# Patient Record
Sex: Male | Born: 1980 | Race: White | Hispanic: No | Marital: Married | Smoking: Never smoker
Health system: Southern US, Community
[De-identification: ages and names within clinical notes are randomized; demographics above are authoritative.]

## PROBLEM LIST (undated history)

## (undated) HISTORY — PX: CLAVICLE SURGERY: SHX598

---

## 2012-07-14 ENCOUNTER — Ambulatory Visit (INDEPENDENT_AMBULATORY_CARE_PROVIDER_SITE_OTHER): Payer: Managed Care, Other (non HMO) | Admitting: Family Medicine

## 2012-07-14 VITALS — BP 120/88 | HR 79 | Temp 98.5°F | Resp 16 | Ht 67.5 in | Wt 208.8 lb

## 2012-07-14 DIAGNOSIS — J029 Acute pharyngitis, unspecified: Secondary | ICD-10-CM

## 2012-07-14 LAB — POCT RAPID STREP A (OFFICE): Rapid Strep A Screen: NEGATIVE

## 2012-07-14 MED ORDER — BENZONATATE 100 MG PO CAPS: 100.0000 mg | ORAL_CAPSULE | Freq: Three times a day (TID) | ORAL | Status: AC | PRN

## 2012-07-14 MED ORDER — IPRATROPIUM BROMIDE 0.03 % NA SOLN: 2.0000 | Freq: Two times a day (BID) | NASAL | Status: AC

## 2012-07-14 MED ORDER — AMOXICILLIN 500 MG PO CAPS: 500.0000 mg | ORAL_CAPSULE | Freq: Three times a day (TID) | ORAL | Status: AC

## 2012-07-14 NOTE — Patient Instructions (Addendum)
Sore Throat A sore throat is pain, burning, irritation, or scratchiness of the throat. There is often pain or tenderness when swallowing or talking. A sore throat may be accompanied by other symptoms, such as coughing, sneezing, fever, and swollen neck glands. A sore throat is often the first sign of another sickness, such as a cold, flu, strep throat, or mononucleosis (commonly known as mono). Most sore throats go away without medical treatment. CAUSES  The most common causes of a sore throat include:  A viral infection, such as a cold, flu, or mono.  A bacterial infection, such as strep throat, tonsillitis, or whooping cough.  Seasonal allergies.  Dryness in the air.  Irritants, such as smoke or pollution.  Gastroesophageal reflux disease (GERD). HOME CARE INSTRUCTIONS   Only take over-the-counter medicines as directed by your caregiver.  Drink enough fluids to keep your urine clear or pale yellow.  Rest as needed.  Try using throat sprays, lozenges, or sucking on hard candy to ease any pain (if older than 4 years or as directed).  Sip warm liquids, such as broth, herbal tea, or warm water with honey to relieve pain temporarily. You may also eat or drink cold or frozen liquids such as frozen ice pops.  Gargle with salt water (mix 1 tsp salt with 8 oz of water).  Do not smoke and avoid secondhand smoke.  Put a cool-mist humidifier in your bedroom at night to moisten the air. You can also turn on a hot shower and sit in the bathroom with the door closed for 5 10 minutes. SEEK IMMEDIATE MEDICAL CARE IF:  You have difficulty breathing.  You are unable to swallow fluids, soft foods, or your saliva.  You have increased swelling in the throat.  Your sore throat does not get better in 7 days.  You have nausea and vomiting.  You have a fever or persistent symptoms for more than 2 3 days.  You have a fever and your symptoms suddenly get worse. MAKE SURE YOU:   Understand  these instructions.  Will watch your condition.  Will get help right away if you are not doing well or get worse. Document Released: 02/06/2004 Document Revised: 12/16/2011 Document Reviewed: 09/06/2011 [employer] Patient Information 2014 [employer].  

## 2012-07-14 NOTE — Progress Notes (Signed)
[address]   [place], [place]  [address]   [phone]  Subjective:    Patient ID: Robert Torres, male    DOB: 07-08-1980, 32 y.o.   MRN: [id number]  HPI This 32 y.o. male presents for evaluation of sore throat. Onset four days ago.  +feverish. +chills/sweats. +HA initially; +ear congestion; mild ear pain; +ST diffuse; +pain with swallowing.  +rhinorrhea; +nasal congestion; +cough.  No n/v/d. No abdominal pain. No tick bites. No rash.  Has been using ginger, raw honey with minimal relief.  Multiple family members sick with similar symptoms; non-smoker.  CPA.  PCP: Deboraha Sprang Physician.  Review of Systems  Constitutional: Positive for fever, chills, diaphoresis and fatigue.  HENT: Positive for ear pain, congestion, sore throat, rhinorrhea, trouble swallowing and voice change. Negative for sinus pressure.   Respiratory: Positive for cough. Negative for shortness of breath, wheezing and stridor.   Gastrointestinal: Negative for nausea, vomiting and diarrhea.  Skin: Negative for rash.  Neurological: Positive for headaches. Negative for dizziness.    History reviewed. No pertinent past medical history.  History reviewed. No pertinent past surgical history.  Prior to Admission medications   Not on File    No Known Allergies  History   Social History  . Marital Status: Married    Spouse Name: N/A    Number of Children: N/A  . Years of Education: N/A   Occupational History  . Not on file.   Social History Main Topics  . Smoking status: Never Smoker   . Smokeless tobacco: Not on file  . Alcohol Use: Not on file  . Drug Use: Not on file  . Sexually Active: Not on file   Other Topics Concern  . Not on file   Social History Narrative  . No narrative on file    No family history on file.     Objective:   Physical Exam  Nursing note and vitals reviewed. Constitutional: He is oriented to person, place, and time. He appears well-developed and well-nourished. No distress.    HENT:  Head: Normocephalic and atraumatic.  Right Ear: External ear normal.  Left Ear: External ear normal.  Nose: Nose normal.  Mouth/Throat: Mucous membranes are normal. Posterior oropharyngeal erythema present. No oropharyngeal exudate, posterior oropharyngeal edema or tonsillar abscesses.  Eyes: Conjunctivae and EOM are normal. Pupils are equal, round, and reactive to light.  Neck: Normal range of motion. Neck supple.  Cardiovascular: Normal rate, regular rhythm and normal heart sounds.   No murmur heard. Pulmonary/Chest: Effort normal and breath sounds normal.  Lymphadenopathy:    He has cervical adenopathy.  Neurological: He is alert and oriented to person, place, and time.  Skin: Skin is warm and dry. No rash noted. He is not diaphoretic.  Psychiatric: He has a normal mood and affect. His behavior is normal.   Results for orders placed in visit on 07/14/12  POCT RAPID STREP A (OFFICE)      Result Value Range   Rapid Strep A Screen Negative  Negative       Assessment & Plan:  Acute pharyngitis - Plan: POCT rapid strep A  1. Acute Pharyngitis:  New. Send throat culture.  Supportive care with Atrovent nasal spray, Tessalon Perles. Recommend Ibuprofen for symptomatic relief.  Rx for Amoxicillin sent to pharmacy while awaiting throat culture due to holiday weekend.  RTC for inability to swallow; advised to start antibiotic if clinically worsens prior to throat culture returning.  Meds ordered this encounter  Medications  .  amoxicillin (AMOXIL) 500 MG capsule    Sig: Take 1 capsule (500 mg total) by mouth 3 (three) times daily.    Dispense:  30 capsule    Refill:  0  . ipratropium (ATROVENT) 0.03 % nasal spray    Sig: Place 2 sprays into the nose 2 (two) times daily.    Dispense:  30 mL    Refill:  5  . benzonatate (TESSALON) 100 MG capsule    Sig: Take 1-2 capsules (100-200 mg total) by mouth 3 (three) times daily as needed for cough.    Dispense:  40 capsule    Refill:   0

## 2012-07-17 LAB — CULTURE, GROUP A STREP

## 2013-07-13 ENCOUNTER — Other Ambulatory Visit: Payer: Self-pay | Admitting: Family Medicine

## 2013-07-13 DIAGNOSIS — K769 Liver disease, unspecified: Secondary | ICD-10-CM

## 2013-07-28 ENCOUNTER — Encounter (INDEPENDENT_AMBULATORY_CARE_PROVIDER_SITE_OTHER): Payer: Self-pay

## 2013-07-28 ENCOUNTER — Ambulatory Visit
Admission: RE | Admit: 2013-07-28 | Discharge: 2013-07-28 | Disposition: A | Discharge status: Home / Self Care | Payer: Managed Care, Other (non HMO) | Source: Ambulatory Visit | Attending: Family Medicine | Admitting: Family Medicine

## 2013-07-28 DIAGNOSIS — K769 Liver disease, unspecified: Secondary | ICD-10-CM

## 2014-12-21 IMAGING — US US ABDOMEN COMPLETE
1 series · 14 of 25 positions shown · non-contrast
Comparison: None.

CLINICAL DATA: Abnormal liver function tests.

EXAM:
ULTRASOUND ABDOMEN COMPLETE

[Series 1: us abdomen complete · 0.32mm/px · 14 of 79 slices shown]
[im 1/79]
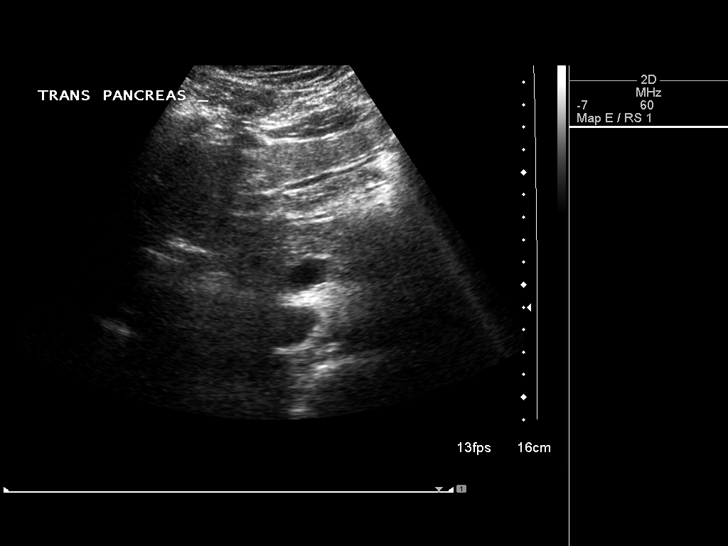
[im 7/79]
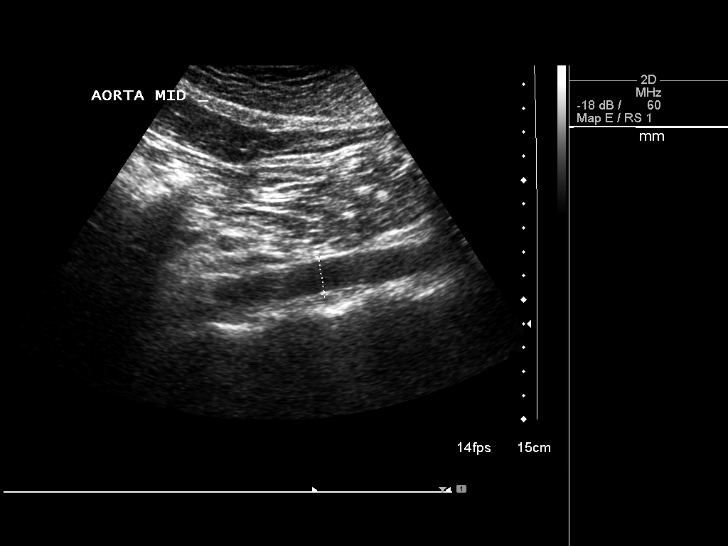
[im 14/79]
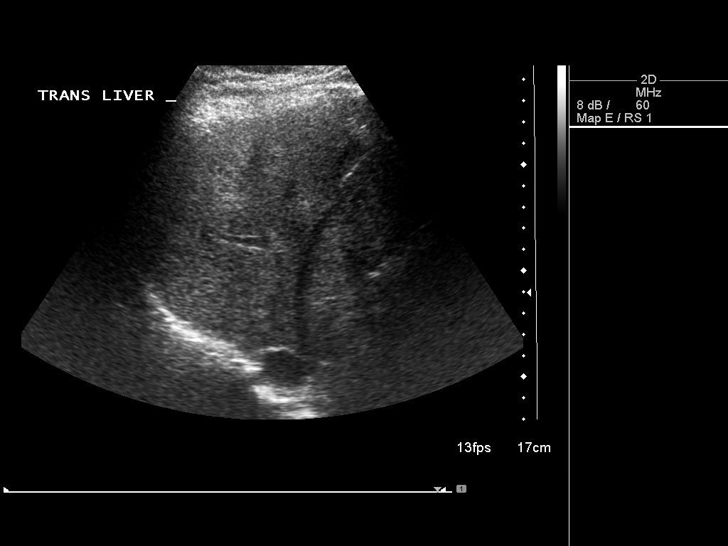
[im 20/79]
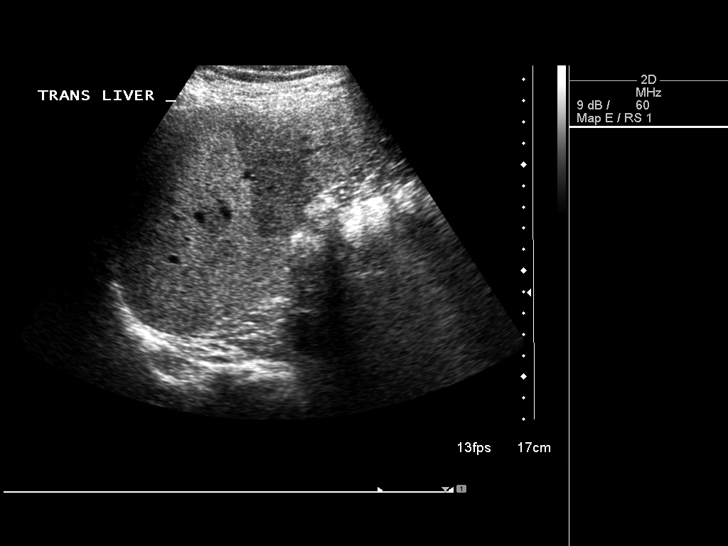
[im 27/79]
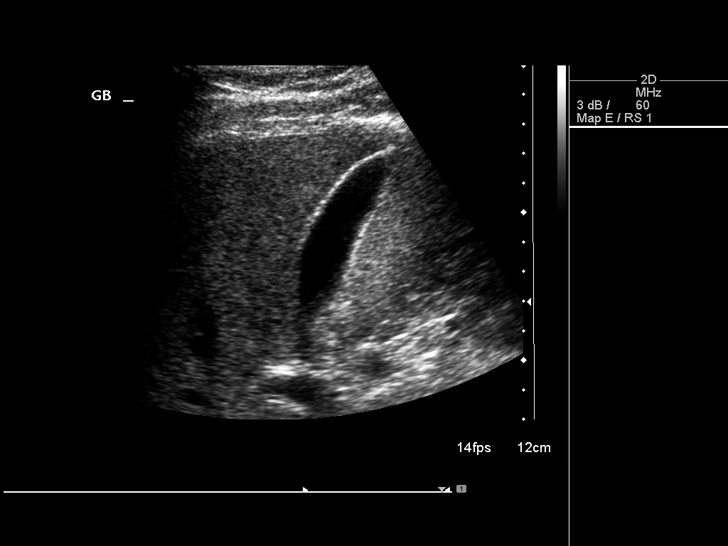
[im 30/79]
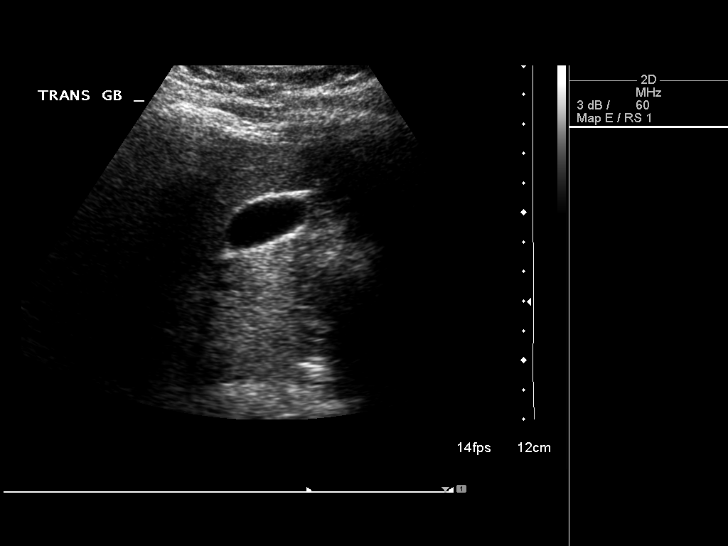
[im 36/79]
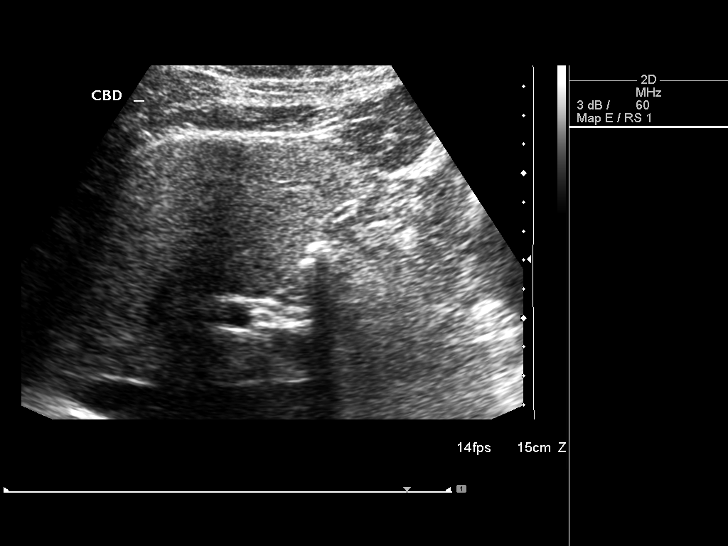
[im 43/79]
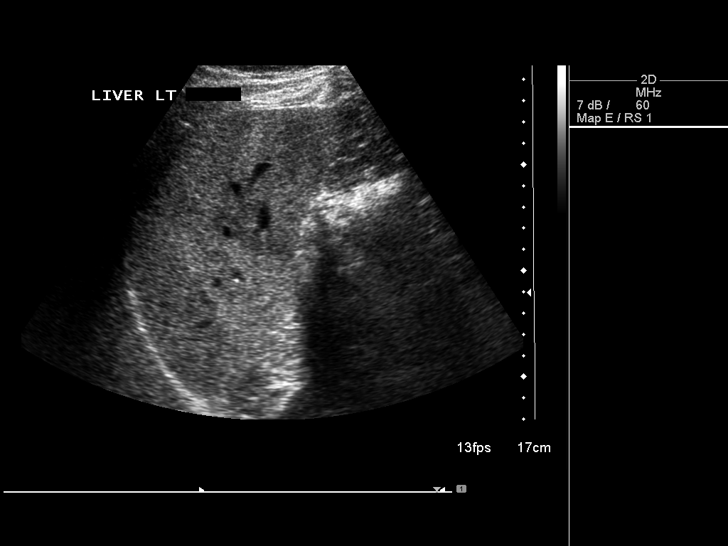
[im 49/79]
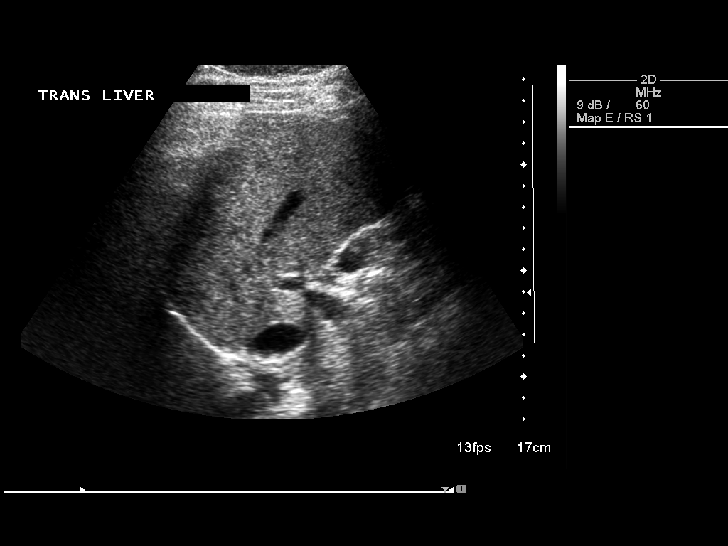
[im 53/79]
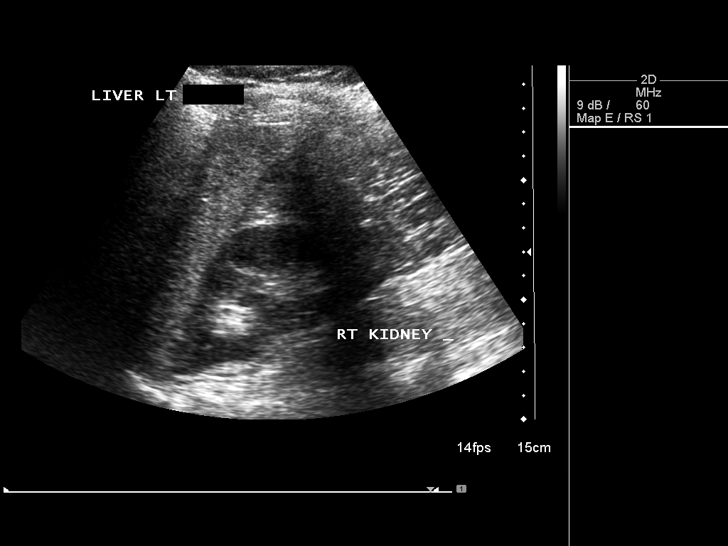
[im 59/79]
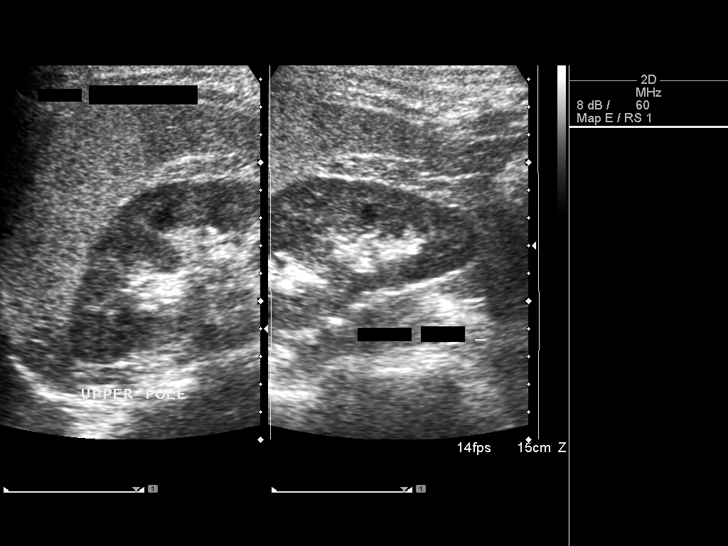
[im 66/79]
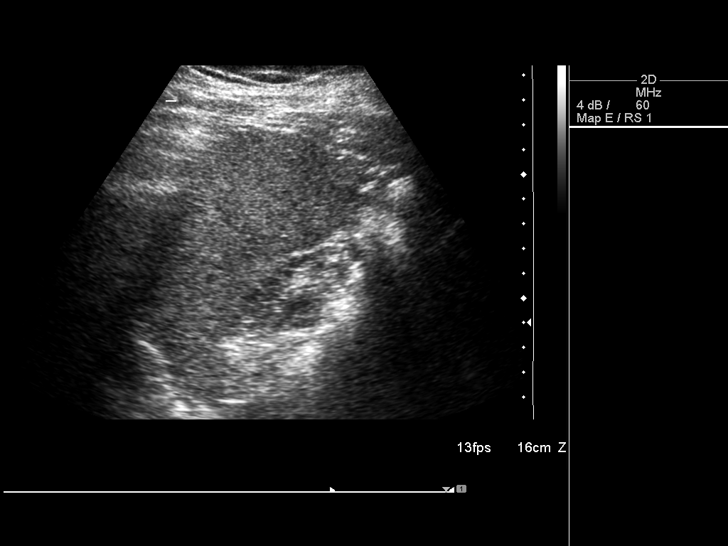
[im 72/79]
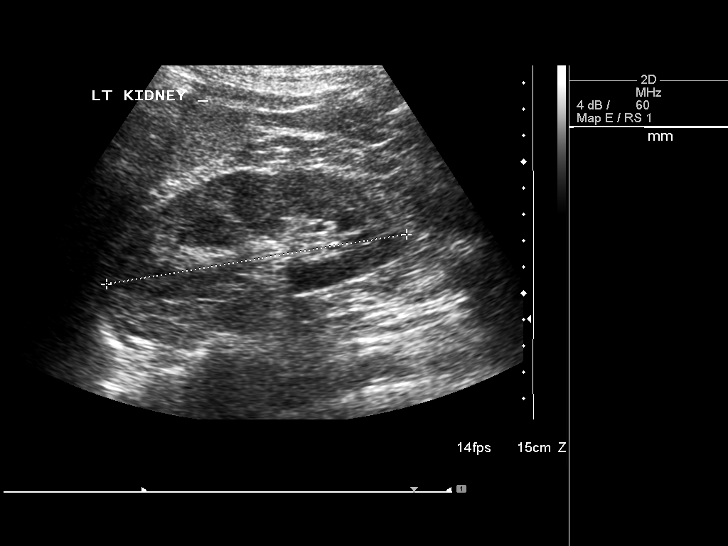
[im 79/79]
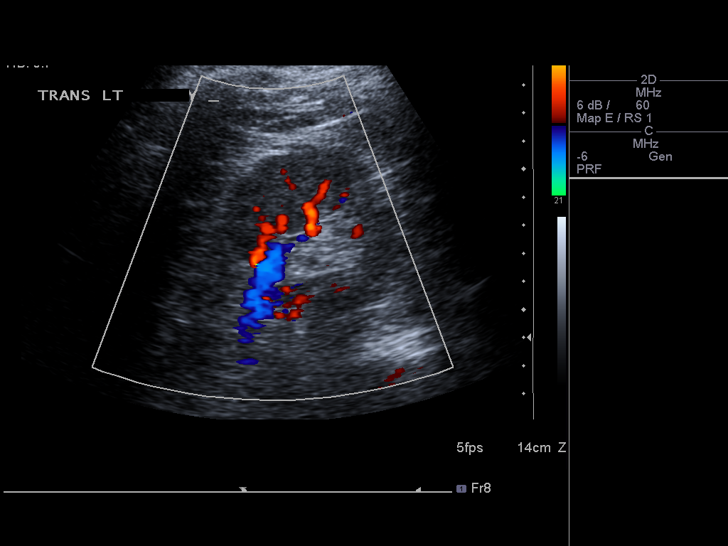

[14 of 25 positions shown; findings below may reference images not displayed]

FINDINGS: Gallbladder:

No gallstones or wall thickening visualized. No sonographic Murphy
sign noted.

Common bile duct:

Diameter: 0.3 cm.

Liver:

The liver demonstrates coarsened and increased echogenicity
compatible with fatty infiltration. No focal lesion or biliary
dilatation.

IVC:

No abnormality visualized.

Pancreas:

Visualized portion unremarkable.

Spleen:

Size and appearance within normal limits.

Right Kidney:

Length: 11.5 cm. Echogenicity within normal limits. No mass or
hydronephrosis visualized.

Left Kidney:

Length: 11.6 cm. Echogenicity within normal limits. No mass or
hydronephrosis visualized.

Abdominal aorta:

No aneurysm visualized.

Other findings:

None.
IMPRESSION: Fatty infiltration of the liver.  The study is otherwise negative.

## 2021-11-17 ENCOUNTER — Encounter (HOSPITAL_BASED_OUTPATIENT_CLINIC_OR_DEPARTMENT_OTHER): Payer: Self-pay | Admitting: Emergency Medicine

## 2021-11-17 ENCOUNTER — Emergency Department (HOSPITAL_BASED_OUTPATIENT_CLINIC_OR_DEPARTMENT_OTHER): Payer: Managed Care, Other (non HMO)

## 2021-11-17 ENCOUNTER — Other Ambulatory Visit: Payer: Self-pay

## 2021-11-17 ENCOUNTER — Emergency Department (HOSPITAL_BASED_OUTPATIENT_CLINIC_OR_DEPARTMENT_OTHER)
Admission: EM | Admit: 2021-11-17 | Discharge: 2021-11-17 | Disposition: A | Discharge status: Home / Self Care | Payer: Managed Care, Other (non HMO) | Attending: Emergency Medicine | Admitting: Emergency Medicine

## 2021-11-17 DIAGNOSIS — M79605 Pain in left leg: Secondary | ICD-10-CM

## 2021-11-17 DIAGNOSIS — M79662 Pain in left lower leg: Secondary | ICD-10-CM | POA: Insufficient documentation

## 2021-11-17 LAB — COMPREHENSIVE METABOLIC PANEL
ALT: 22 U/L (ref 0–44)
AST: 20 U/L (ref 15–41)
Albumin: 4.4 g/dL (ref 3.5–5.0)
Alkaline Phosphatase: 32 U/L — ABNORMAL LOW (ref 38–126)
Anion gap: 12 (ref 5–15)
BUN: 16 mg/dL (ref 6–20)
CO2: 25 mmol/L (ref 22–32)
Calcium: 9.5 mg/dL (ref 8.9–10.3)
Chloride: 98 mmol/L (ref 98–111)
Creatinine, Ser: 1.05 mg/dL (ref 0.61–1.24)
GFR, Estimated: >60 mL/min (ref >60)
Glucose, Bld: 105 mg/dL — ABNORMAL HIGH (ref 70–99)
Potassium: 3.7 mmol/L (ref 3.5–5.1)
Sodium: 135 mmol/L (ref 135–145)
Total Bilirubin: 0.5 mg/dL (ref 0.3–1.2)
Total Protein: 7.4 g/dL (ref 6.5–8.1)

## 2021-11-17 LAB — CBC WITH DIFFERENTIAL/PLATELET
Abs Immature Granulocytes: 0.02 10*3/uL (ref 0.00–0.07)
Basophils Absolute: 0 10*3/uL (ref 0.0–0.1)
Basophils Relative: 1 %
Eosinophils Absolute: 0.2 10*3/uL (ref 0.0–0.5)
Eosinophils Relative: 3 %
HCT: 49.8 % (ref 39.0–52.0)
Hemoglobin: 16.3 g/dL (ref 13.0–17.0)
Immature Granulocytes: 0 %
Lymphocytes Relative: 30 %
Lymphs Abs: 1.9 10*3/uL (ref 0.7–4.0)
MCH: 27.2 pg (ref 26.0–34.0)
MCHC: 32.7 g/dL (ref 30.0–36.0)
MCV: 83.1 fL (ref 80.0–100.0)
Monocytes Absolute: 0.4 10*3/uL (ref 0.1–1.0)
Monocytes Relative: 6 %
Neutro Abs: 3.9 10*3/uL (ref 1.7–7.7)
Neutrophils Relative %: 60 %
Platelets: 242 10*3/uL (ref 150–400)
RBC: 5.99 MIL/uL — ABNORMAL HIGH (ref 4.22–5.81)
RDW: 13.1 % (ref 11.5–15.5)
WBC: 6.4 10*3/uL (ref 4.0–10.5)
nRBC: 0 % (ref 0.0–0.2)

## 2021-11-17 NOTE — Discharge Instructions (Signed)
You were seen today for knee and leg pain.  Your DVT study does not show any deep blood clot.  However your musculoskeletal ultrasound performed shows one vessel that appears to have a superficial venous thrombosis.  Fortunately, these are benign, no acute indication for any blood thinners or any other intervention at this time.  Signs or symptoms slowly improved, you are stable to follow-up in the outpatient setting with a primary care provider.  You may use warm compresses or nonsteroidal anti-inflammatory drugs such as ibuprofen to treat the symptoms in the interim.  Thank for the opportunity to participate in your care, Robert Sciara MD.  Please return with any worsening symptoms including fevers or chills, nausea vomiting, syncope or shortness of breath.

## 2021-11-17 NOTE — ED Provider Notes (Signed)
[facility] EMERGENCY DEPT Provider Note   CSN: [id number] Arrival date & time: 11/17/21  1215     History Chief Complaint  Patient presents with   Leg Pain    HPI Alazar Cherian is a 41 y.o. male presenting for chief complaint of leg pain.  He endorses left-sided mid calf to upper thigh pain that started this morning.  It is still present.  He denies fevers or chills, nausea vomiting, some shortness of breath.  He does take testosterone hormone therapy.  He otherwise is healthy up-to-date on vaccines.  No other medical problems.   Patient's recorded medical, surgical, social, medication list and allergies were reviewed in the Snapshot window as part of the initial history.   Review of Systems   Review of Systems  Constitutional:  Negative for chills and fever.  HENT:  Negative for ear pain and sore throat.   Eyes:  Negative for pain and visual disturbance.  Respiratory:  Negative for cough and shortness of breath.   Cardiovascular:  Positive for leg swelling. Negative for chest pain and palpitations.  Gastrointestinal:  Negative for abdominal pain and vomiting.  Genitourinary:  Negative for dysuria and hematuria.  Musculoskeletal:  Negative for arthralgias and back pain.  Skin:  Negative for color change and rash.  Neurological:  Negative for seizures and syncope.  All other systems reviewed and are negative.   Physical Exam Updated Vital Signs BP 120/76   Pulse 76   Temp 98.4 F (36.9 C)   Resp 16   Ht 5\' 7"  (1.702 m)   Wt 94.3 kg   SpO2 97%   BMI 32.58 kg/m  Physical Exam Vitals and nursing note reviewed.  Constitutional:      General: He is not in acute distress.    Appearance: He is well-developed.  HENT:     Head: Normocephalic and atraumatic.  Eyes:     Conjunctiva/sclera: Conjunctivae normal.  Cardiovascular:     Rate and Rhythm: Normal rate and regular rhythm.     Heart sounds: No murmur heard. Pulmonary:     Effort: Pulmonary effort is  normal. No respiratory distress.     Breath sounds: Normal breath sounds.  Abdominal:     Palpations: Abdomen is soft.     Tenderness: There is no abdominal tenderness.  Musculoskeletal:        General: Swelling, tenderness and deformity present.     Cervical back: Neck supple.  Skin:    General: Skin is warm and dry.     Capillary Refill: Capillary refill takes less than 2 seconds.  Neurological:     Mental Status: He is alert.  Psychiatric:        Mood and Affect: Mood normal.      ED Course/ Medical Decision Making/ A&P    Procedures Procedures   Medications Ordered in ED Medications - No data to display  Medical Decision Making:    Slayde Brault is a 41 y.o. male who presented to the ED today with leg swelling and pain detailed above.     Patient placed on continuous vitals and telemetry monitoring while in ED which was reviewed periodically.   Complete initial physical exam performed, notably the patient  was hemodynamically stable in no acute distress.  Point tenderness over left anterior thigh.      Reviewed and confirmed nursing documentation for past medical history, family history, social history.    Initial Assessment:   With the patient's presentation of leg swelling, most  likely diagnosis is DVT.  Also considered cellulitis, abscess or any other structural underlying musculoskeletal pathology.  Fortunately, patient's exam is grossly reassuring at this time.  Point-of-care ultrasound demonstrates likely superficial venous thrombosis.  However this does not exclude DVT.  DVT study ordered and reviewed with no evidence of acute deep vein thrombosis.  Will reinforce supportive care to the patient and recommended follow-up closely with their primary care provider.  Patient discharged with no further acute events.  This is most consistent with an acute life/limb threatening illness complicated by underlying chronic conditions. Disposition:  I have considered need for  hospitalization, however, considering all of the above, I believe this patient is stable for discharge at this time.  Patient/family educated about specific return precautions for given chief complaint and symptoms.  Patient/family educated about follow-up with PCP .     Patient/family expressed understanding of return precautions and need for follow-up. Patient spoken to regarding all imaging and laboratory results and appropriate follow up for these results. All education provided in verbal form with additional information in written form. Time was allowed for answering of patient questions. Patient discharged.    Emergency Department Medication Summary:   Medications - No data to display      Clinical Impression:  1. Pain of left lower extremity      Discharge   Final Clinical Impression(s) / ED Diagnoses Final diagnoses:  Pain of left lower extremity    Rx / DC Orders ED Discharge Orders     None         Glyn Ade, MD 11/17/21 1639

## 2021-11-17 NOTE — ED Triage Notes (Addendum)
Pt via pov from home with sharp left leg pain this morning. He reports that it seems to be moving higher in his leg. Pt points to left calf and behind the knee. Pt alert & oriented, nad noted. Denies SOB
